# Patient Record
Sex: Female | Born: 2000 | Hispanic: No | Smoking: Never smoker
Health system: Southern US, Community
[De-identification: ages and names within clinical notes are randomized; demographics above are authoritative.]

## PROBLEM LIST (undated history)

## (undated) ENCOUNTER — Emergency Department (HOSPITAL_COMMUNITY): Admission: EM | Payer: Medicaid Other | Source: Home / Self Care

## (undated) DIAGNOSIS — J45909 Unspecified asthma, uncomplicated: Secondary | ICD-10-CM

## (undated) DIAGNOSIS — F909 Attention-deficit hyperactivity disorder, unspecified type: Secondary | ICD-10-CM

---
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
## 2019-06-22 ENCOUNTER — Encounter (HOSPITAL_COMMUNITY): Payer: Self-pay | Admitting: Emergency Medicine

## 2019-06-22 ENCOUNTER — Emergency Department (HOSPITAL_COMMUNITY)
Admission: EM | Admit: 2019-06-22 | Discharge: 2019-06-22 | Discharge status: Against Medical Advice | Payer: Medicaid Other | Attending: Emergency Medicine | Admitting: Emergency Medicine

## 2019-06-22 DIAGNOSIS — Z5321 Procedure and treatment not carried out due to patient leaving prior to being seen by health care provider: Secondary | ICD-10-CM | POA: Insufficient documentation

## 2019-06-22 DIAGNOSIS — R300 Dysuria: Secondary | ICD-10-CM | POA: Diagnosis present

## 2019-06-22 LAB — URINALYSIS, ROUTINE W REFLEX MICROSCOPIC
Bilirubin Urine: NEGATIVE
Glucose, UA: NEGATIVE mg/dL
Ketones, ur: 5 mg/dL — AB
Nitrite: NEGATIVE
Protein, ur: 100 mg/dL — AB
RBC / HPF: >50 RBC/hpf — ABNORMAL HIGH (ref 0–5)
Specific Gravity, Urine: 1.019 (ref 1.005–1.030)
WBC, UA: >50 WBC/hpf — ABNORMAL HIGH (ref 0–5)
pH: 6 (ref 5.0–8.0)

## 2019-06-22 NOTE — ED Triage Notes (Signed)
Pt here from home with c/o uti type symptoms , frequency burning upon urination and some scant amount of blood

## 2019-06-22 NOTE — ED Notes (Signed)
Pt called for room x2, no answer ?

## 2019-06-22 NOTE — ED Notes (Signed)
Called multiple times, will try again in a few mins

## 2019-11-15 ENCOUNTER — Encounter: Payer: Self-pay | Admitting: General Practice

## 2020-08-04 ENCOUNTER — Ambulatory Visit (HOSPITAL_COMMUNITY)
Admission: EM | Admit: 2020-08-04 | Discharge: 2020-08-04 | Disposition: A | Discharge status: Home / Self Care | Payer: Medicaid Other | Attending: Emergency Medicine | Admitting: Emergency Medicine

## 2020-08-04 ENCOUNTER — Other Ambulatory Visit: Payer: Self-pay

## 2020-08-04 ENCOUNTER — Encounter (HOSPITAL_COMMUNITY): Payer: Self-pay

## 2020-08-04 DIAGNOSIS — R300 Dysuria: Secondary | ICD-10-CM

## 2020-08-04 DIAGNOSIS — R8290 Unspecified abnormal findings in urine: Secondary | ICD-10-CM | POA: Diagnosis not present

## 2020-08-04 DIAGNOSIS — Z3202 Encounter for pregnancy test, result negative: Secondary | ICD-10-CM | POA: Diagnosis not present

## 2020-08-04 DIAGNOSIS — R350 Frequency of micturition: Secondary | ICD-10-CM

## 2020-08-04 LAB — POCT URINALYSIS DIPSTICK, ED / UC
Bilirubin Urine: NEGATIVE
Glucose, UA: 100 mg/dL — AB
Ketones, ur: NEGATIVE mg/dL
Nitrite: POSITIVE — AB
Protein, ur: 30 mg/dL — AB
Specific Gravity, Urine: 1.015 (ref 1.005–1.030)
Urobilinogen, UA: 1 mg/dL (ref 0.0–1.0)
pH: 6.5 (ref 5.0–8.0)

## 2020-08-04 LAB — POC URINE PREG, ED: Preg Test, Ur: NEGATIVE

## 2020-08-04 MED ORDER — NITROFURANTOIN MONOHYD MACRO 100 MG PO CAPS: 100.0000 mg | ORAL_CAPSULE | Freq: Two times a day (BID) | ORAL | 0 refills | Status: DC

## 2020-08-04 NOTE — ED Provider Notes (Signed)
[facility]   CC: UTI  SUBJECTIVE:  Jennifer Fischer is a 19 y.o. female who complains of urinary frequency, urgency and dysuria for the past 4 days.  Patient denies a precipitating event, recent sexual encounter, excessive caffeine intake. Localizes the pain to the lower abdomen. Pain is intermittent and describes it as sharp. Has history of UTIs. Is taking Azo with temporary relief.  Is also taking norethindrone birth control daily.  Reports that she gets this from an online clinic call [facility]. Symptoms are made worse with urination. Admits to similar symptoms in the past.  Denies fever, chills, nausea, vomiting, flank pain, abnormal vaginal discharge or bleeding, hematuria.    LMP: Patient's last menstrual period was 07/17/2020.  ROS: As in HPI.  All other pertinent ROS negative.     Past Medical History:  Diagnosis Date  . ADHD (attention deficit hyperactivity disorder)   . Asthma    History reviewed. No pertinent surgical history. No Known Allergies No current facility-administered medications on file prior to encounter.   Current Outpatient Medications on File Prior to Encounter  Medication Sig Dispense Refill  . albuterol (PROVENTIL HFA;VENTOLIN HFA) 108 (90 BASE) MCG/ACT inhaler Inhale 2 puffs into the lungs every 6 (six) hours as needed for wheezing or shortness of breath.    . beclomethasone (QVAR) 40 MCG/ACT inhaler Inhale 2 puffs into the lungs 2 (two) times daily.    . cetirizine (ZYRTEC) 10 MG tablet Take 10 mg by mouth at bedtime.  2  . cloNIDine (CATAPRES) 0.1 MG tablet Take 0.2 mg by mouth at bedtime.  1  . ibuprofen (ADVIL,MOTRIN) 400 MG tablet Take 1 tablet (400 mg total) by mouth every 6 (six) hours as needed. 24 tablet 0  . magic mouthwash SOLN Take 5 mLs by mouth 3 (three) times daily as needed for mouth pain. 45 mL 0  . methylphenidate (METADATE CD) 40 MG CR capsule Take 40 mg by mouth every morning.     Social History   Socioeconomic History   . Marital status: Single    Spouse name: Not on file  . Number of children: Not on file  . Years of education: Not on file  . Highest education level: Not on file  Occupational History  . Not on file  Tobacco Use  . Smoking status: Never Smoker  Substance and Sexual Activity  . Alcohol use: No  . Drug use: No  . Sexual activity: Not on file  Other Topics Concern  . Not on file  Social History Narrative  . Not on file   Social Determinants of Health   Financial Resource Strain:   . Difficulty of Paying Living Expenses: Not on file  Food Insecurity:   . Worried About Programme researcher, broadcasting/film/video in the Last Year: Not on file  . Ran Out of Food in the Last Year: Not on file  Transportation Needs:   . Lack of Transportation (Medical): Not on file  . Lack of Transportation (Non-Medical): Not on file  Physical Activity:   . Days of Exercise per Week: Not on file  . Minutes of Exercise per Session: Not on file  Stress:   . Feeling of Stress : Not on file  Social Connections:   . Frequency of Communication with Friends and Family: Not on file  . Frequency of Social Gatherings with Friends and Family: Not on file  . Attends Religious Services: Not on file  . Active Member of Clubs or Organizations:  Not on file  . Attends Banker Meetings: Not on file  . Marital Status: Not on file  Intimate Partner Violence:   . Fear of Current or Ex-Partner: Not on file  . Emotionally Abused: Not on file  . Physically Abused: Not on file  . Sexually Abused: Not on file   History reviewed. No pertinent family history.  OBJECTIVE:  Vitals:   08/04/20 1502  BP: 109/69  Pulse: 91  Resp: 16  Temp: 98.4 F (36.9 C)  TempSrc: Oral  SpO2: 100%   General appearance: AOx3 in no acute distress HEENT: NCAT. Oropharynx clear.  Lungs: clear to auscultation bilaterally without adventitious breath sounds Heart: regular rate and rhythm. Radial pulses 2+ symmetrical bilaterally Abdomen:  soft; non-distended; suprapubic tenderness; bowel sounds present; no guarding or rebound tenderness Back: no CVA tenderness Extremities: no edema; symmetrical with no gross deformities Skin: warm and dry Neurologic: Ambulates from chair to exam table without difficulty Psychological: alert and cooperative; normal mood and affect  Labs Reviewed  POCT URINALYSIS DIPSTICK, ED / UC - Abnormal; Notable for the following components:      Result Value   Glucose, UA 100 (*)    Hgb urine dipstick TRACE (*)    Protein, ur 30 (*)    Nitrite POSITIVE (*)    Leukocytes,Ua LARGE (*)    All other components within normal limits  URINE CULTURE  POC URINE PREG, ED    ASSESSMENT & PLAN:  1. Dysuria   2. Urinary frequency   3. Malodorous urine     Meds ordered this encounter  Medications  . nitrofurantoin, macrocrystal-monohydrate, (MACROBID) 100 MG capsule    Sig: Take 1 capsule (100 mg total) by mouth 2 (two) times daily.    Dispense:  10 capsule    Refill:  0    Order Specific Question:   Supervising Provider    Answer:   Merrilee Jansky [id number]    Urine culture sent  We will call you with abnormal results that need further treatment Push fluids and get plenty of rest Prescribed Macrobid Take antibiotic as directed and to completion Take pyridium as prescribed and as needed for symptomatic relief Follow up with PCP if symptoms persists Return here or go to ER if you have any new or worsening symptoms such as fever, worsening abdominal pain, nausea/vomiting, flank pain  Outlined signs and symptoms indicating need for more acute intervention Patient verbalized understanding After Visit Summary given     [name], NP 08/04/20 1611

## 2020-08-04 NOTE — ED Triage Notes (Signed)
Pt present urinary frequency,burning while urinating and abdominal pain. Symptoms started a week ago.pt state her urinary has a foul odor smell too.

## 2020-08-04 NOTE — Discharge Instructions (Signed)
I have sent in Macrobid for you to take twice a day for 5 days  You may continue to use Azo for the next 2 days  We will culture your urine and will call if treatment needs to be changed  Follow-up with this office or primary care if symptoms are not improving over the next 2 days

## 2020-08-05 ENCOUNTER — Telehealth (HOSPITAL_COMMUNITY): Payer: Self-pay | Admitting: *Deleted

## 2020-08-05 MED ORDER — NITROFURANTOIN MONOHYD MACRO 100 MG PO CAPS: 100.0000 mg | ORAL_CAPSULE | Freq: Two times a day (BID) | ORAL | 0 refills | Status: DC

## 2020-08-06 LAB — URINE CULTURE: Culture: >=100000 — AB

## 2020-08-10 ENCOUNTER — Telehealth (HOSPITAL_COMMUNITY): Payer: Self-pay | Admitting: Emergency Medicine

## 2020-08-10 NOTE — Telephone Encounter (Signed)
Patient called stating she is unable to keep the MAcrobid she was prescribed down, that she had "vomiting all night long" when she tried to take it.  Will follow up with provider for guidance on changes.

## 2020-08-10 NOTE — Telephone Encounter (Signed)
Reached out to [place], APP, and further assessment was done of patient.  She states she has had body aches and "not a normal body temperature" as well.   Per Judeth Cornfield, APP encourage patient to return for follow up visit.  Patient was informed and verbalized understanding.

## 2020-11-08 ENCOUNTER — Other Ambulatory Visit: Payer: Medicaid Other

## 2021-01-28 ENCOUNTER — Emergency Department (HOSPITAL_BASED_OUTPATIENT_CLINIC_OR_DEPARTMENT_OTHER): Payer: Medicaid Other

## 2021-01-28 ENCOUNTER — Other Ambulatory Visit: Payer: Self-pay

## 2021-01-28 ENCOUNTER — Emergency Department (HOSPITAL_BASED_OUTPATIENT_CLINIC_OR_DEPARTMENT_OTHER)
Admission: EM | Admit: 2021-01-28 | Discharge: 2021-01-28 | Disposition: A | Discharge status: Home / Self Care | Payer: Medicaid Other | Attending: Emergency Medicine | Admitting: Emergency Medicine

## 2021-01-28 ENCOUNTER — Encounter (HOSPITAL_BASED_OUTPATIENT_CLINIC_OR_DEPARTMENT_OTHER): Payer: Self-pay

## 2021-01-28 DIAGNOSIS — Y92410 Unspecified street and highway as the place of occurrence of the external cause: Secondary | ICD-10-CM | POA: Diagnosis not present

## 2021-01-28 DIAGNOSIS — S161XXA Strain of muscle, fascia and tendon at neck level, initial encounter: Secondary | ICD-10-CM | POA: Diagnosis not present

## 2021-01-28 DIAGNOSIS — S8001XA Contusion of right knee, initial encounter: Secondary | ICD-10-CM | POA: Insufficient documentation

## 2021-01-28 DIAGNOSIS — S32020A Wedge compression fracture of second lumbar vertebra, initial encounter for closed fracture: Secondary | ICD-10-CM | POA: Diagnosis not present

## 2021-01-28 DIAGNOSIS — Y939 Activity, unspecified: Secondary | ICD-10-CM | POA: Diagnosis not present

## 2021-01-28 DIAGNOSIS — S32030A Wedge compression fracture of third lumbar vertebra, initial encounter for closed fracture: Secondary | ICD-10-CM | POA: Insufficient documentation

## 2021-01-28 DIAGNOSIS — R55 Syncope and collapse: Secondary | ICD-10-CM | POA: Diagnosis not present

## 2021-01-28 DIAGNOSIS — S199XXA Unspecified injury of neck, initial encounter: Secondary | ICD-10-CM | POA: Diagnosis present

## 2021-01-28 DIAGNOSIS — Z79899 Other long term (current) drug therapy: Secondary | ICD-10-CM | POA: Insufficient documentation

## 2021-01-28 DIAGNOSIS — S9001XA Contusion of right ankle, initial encounter: Secondary | ICD-10-CM | POA: Diagnosis not present

## 2021-01-28 DIAGNOSIS — Y999 Unspecified external cause status: Secondary | ICD-10-CM | POA: Diagnosis not present

## 2021-01-28 DIAGNOSIS — J45909 Unspecified asthma, uncomplicated: Secondary | ICD-10-CM | POA: Diagnosis not present

## 2021-01-28 LAB — URINALYSIS, ROUTINE W REFLEX MICROSCOPIC
Bilirubin Urine: NEGATIVE
Glucose, UA: NEGATIVE mg/dL
Hgb urine dipstick: NEGATIVE
Ketones, ur: NEGATIVE mg/dL
Leukocytes,Ua: NEGATIVE
Nitrite: NEGATIVE
Protein, ur: 100 mg/dL — AB
Specific Gravity, Urine: >1.03 — ABNORMAL HIGH (ref 1.005–1.030)
pH: 6.5 (ref 5.0–8.0)

## 2021-01-28 LAB — URINALYSIS, MICROSCOPIC (REFLEX)

## 2021-01-28 LAB — PREGNANCY, URINE: Preg Test, Ur: NEGATIVE

## 2021-01-28 MED ORDER — IBUPROFEN 800 MG PO TABS
800.0000 mg | ORAL_TABLET | Freq: Once | ORAL | Status: AC
  Administered 2021-01-28: 800 mg via ORAL
  Filled 2021-01-28: qty 1

## 2021-01-28 MED ORDER — IBUPROFEN 800 MG PO TABS: 800.0000 mg | ORAL_TABLET | Freq: Three times a day (TID) | ORAL | 0 refills | Status: DC | PRN

## 2021-01-28 NOTE — ED Notes (Signed)
Pt drinking her tea form [employer]
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
## 2021-01-28 NOTE — ED Notes (Signed)
Someone from the [facility] will be bringing a  Lumbar corsett for pt to wear , she will be here in abot an 1 1/2 hors . Pt ok with this
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
## 2022-11-16 ENCOUNTER — Emergency Department (HOSPITAL_BASED_OUTPATIENT_CLINIC_OR_DEPARTMENT_OTHER)
Admission: EM | Admit: 2022-11-16 | Discharge: 2022-11-16 | Disposition: A | Discharge status: Home / Self Care | Payer: Medicaid Other | Attending: Emergency Medicine | Admitting: Emergency Medicine

## 2022-11-16 ENCOUNTER — Other Ambulatory Visit: Payer: Self-pay

## 2022-11-16 ENCOUNTER — Encounter (HOSPITAL_BASED_OUTPATIENT_CLINIC_OR_DEPARTMENT_OTHER): Payer: Self-pay

## 2022-11-16 DIAGNOSIS — R059 Cough, unspecified: Secondary | ICD-10-CM | POA: Diagnosis present

## 2022-11-16 DIAGNOSIS — U071 COVID-19: Secondary | ICD-10-CM | POA: Insufficient documentation

## 2022-11-16 LAB — RESP PANEL BY RT-PCR (RSV, FLU A&B, COVID)  RVPGX2
Influenza A by PCR: NEGATIVE
Influenza B by PCR: NEGATIVE
Resp Syncytial Virus by PCR: NEGATIVE
SARS Coronavirus 2 by RT PCR: POSITIVE — AB

## 2022-11-16 NOTE — ED Provider Notes (Signed)
[facility] Provider Note   CSN: [id number] Arrival date & time: 11/16/22  1810     History  Chief Complaint  Patient presents with   Nasal Congestion    Jennifer Fischer is a 22 y.o. female.  Patient complains of a cough and congestion.  Patient reports she has been sick on and off for 2 weeks but began feeling worse 3 days ago.  Patient complains of bodyaches sore throat and cough.  Patient denies being around anyone who was sick  The history is provided by the patient. No language interpreter was used.       Home Medications Prior to Admission medications   Medication Sig Start Date End Date Taking? Authorizing Provider  albuterol (PROVENTIL HFA;VENTOLIN HFA) 108 (90 BASE) MCG/ACT inhaler Inhale 2 puffs into the lungs every 6 (six) hours as needed for wheezing or shortness of breath.    [provider]  beclomethasone (QVAR) 40 MCG/ACT inhaler Inhale 2 puffs into the lungs 2 (two) times daily.    [provider]  cetirizine (ZYRTEC) 10 MG tablet Take 10 mg by mouth at bedtime. 08/27/15   [provider]  cloNIDine (CATAPRES) 0.1 MG tablet Take 0.2 mg by mouth at bedtime. 07/23/15   [provider]  ibuprofen (ADVIL) 800 MG tablet Take 1 tablet (800 mg total) by mouth every 8 (eight) hours as needed. 01/28/21   Hayden Rasmussen, MD  magic mouthwash SOLN Take 5 mLs by mouth 3 (three) times daily as needed for mouth pain. 09/07/15   Gloriann Loan, PA-C  methylphenidate (METADATE CD) 40 MG CR capsule Take 40 mg by mouth every morning.    [provider]  nitrofurantoin, macrocrystal-monohydrate, (MACROBID) 100 MG capsule Take 1 capsule (100 mg total) by mouth 2 (two) times daily. 08/05/20   Faustino Congress, NP      Allergies    Patient has no known allergies.    Review of Systems   Review of Systems  All other systems reviewed and are negative.   Physical Exam Updated Vital Signs BP 128/85 (BP  Location: Right Arm)   Pulse (!) 110   Temp 99.1 F (37.3 C) (Oral)   Resp 16   Ht 5\' 2"  (1.575 m)   Wt 46.4 kg   LMP 10/31/2022 (Exact Date)   SpO2 100%   BMI 18.71 kg/m  Physical Exam Vitals and nursing note reviewed.  Constitutional:      General: She is not in acute distress.    Appearance: She is well-developed.  HENT:     Head: Normocephalic and atraumatic.  Eyes:     Conjunctiva/sclera: Conjunctivae normal.  Cardiovascular:     Rate and Rhythm: Normal rate and regular rhythm.     Heart sounds: No murmur heard. Pulmonary:     Effort: Pulmonary effort is normal. No respiratory distress.     Breath sounds: Normal breath sounds.  Abdominal:     Palpations: Abdomen is soft.     Tenderness: There is no abdominal tenderness.  Musculoskeletal:        General: No swelling.     Cervical back: Neck supple.  Skin:    General: Skin is warm and dry.     Capillary Refill: Capillary refill takes less than 2 seconds.  Neurological:     Mental Status: She is alert.  Psychiatric:        Mood and Affect: Mood normal.     ED Results / Procedures / Treatments  Labs (all labs ordered are listed, but only abnormal results are displayed) Labs Reviewed  RESP PANEL BY RT-PCR (RSV, FLU A&B, COVID)  RVPGX2 - Abnormal; Notable for the following components:      Result Value   SARS Coronavirus 2 by RT PCR POSITIVE (*)    All other components within normal limits    EKG None  Radiology No results found.  Procedures Procedures    Medications Ordered in ED Medications - No data to display  ED Course/ Medical Decision Making/ A&P                             Medical Decision Making Patient complains of a cough fever and bodyaches  Amount and/or Complexity of Data Reviewed Labs: ordered. Decision-making details documented in ED Course.    Details: Labs ordered reviewed and interpreted COVID is positive  Risk Risk Details: Still on COVID management she is advised  Tylenol or ibuprofen for fever and body aches patient is given a note to be out of work for the next 3 days.            Final Clinical Impression(s) / ED Diagnoses Final diagnoses:  COVID    Rx / DC Orders ED Discharge Orders     None      An After Visit Summary was printed and given to the patient.    Fransico Meadow, PA-C 11/16/22 [place], [place], DO 11/16/22 2308

## 2022-11-16 NOTE — Discharge Instructions (Addendum)
Tylenol or ibuprofen for fever and body aches

## 2022-11-16 NOTE — ED Triage Notes (Signed)
Pt states she has had cold symptoms, nausea, fatigue x 2 weeks.  Low grade fever

## 2023-01-03 ENCOUNTER — Emergency Department (HOSPITAL_BASED_OUTPATIENT_CLINIC_OR_DEPARTMENT_OTHER)
Admission: EM | Admit: 2023-01-03 | Discharge: 2023-01-03 | Disposition: A | Discharge status: Home / Self Care | Payer: Medicaid Other | Attending: Emergency Medicine | Admitting: Emergency Medicine

## 2023-01-03 ENCOUNTER — Encounter (HOSPITAL_BASED_OUTPATIENT_CLINIC_OR_DEPARTMENT_OTHER): Payer: Self-pay

## 2023-01-03 ENCOUNTER — Other Ambulatory Visit: Payer: Self-pay

## 2023-01-03 DIAGNOSIS — R0981 Nasal congestion: Secondary | ICD-10-CM | POA: Insufficient documentation

## 2023-01-03 DIAGNOSIS — Z20822 Contact with and (suspected) exposure to covid-19: Secondary | ICD-10-CM | POA: Insufficient documentation

## 2023-01-03 LAB — RESP PANEL BY RT-PCR (RSV, FLU A&B, COVID)  RVPGX2
Influenza A by PCR: NEGATIVE
Influenza B by PCR: NEGATIVE
Resp Syncytial Virus by PCR: NEGATIVE
SARS Coronavirus 2 by RT PCR: NEGATIVE

## 2023-01-03 LAB — PREGNANCY, URINE: Preg Test, Ur: NEGATIVE

## 2023-01-03 NOTE — ED Triage Notes (Signed)
C/o congestion, cough, headache x 1 week. Also took a pregnancy test last week and yesterday which came back positive. LMP 1/29. G1P0.

## 2023-01-03 NOTE — ED Notes (Signed)
ED Provider at bedside. 

## 2023-01-03 NOTE — Discharge Instructions (Signed)
Today I offered you a blood pregnancy test to see if you were pregnant and just very early on in your pregnancy, you declined, your urine pregnancy test was negative however.  Please follow-up with your primary care doctor, for a blood pregnancy test for confirmation of results.  Additionally please return to the ER if you have any severe shortness of breath.

## 2023-01-03 NOTE — ED Provider Notes (Signed)
[place] [place] [facility] Provider Note   CSN: [id number] Arrival date & time: 01/03/23  1459     History  Chief Complaint  Patient presents with   Nasal Congestion    Jennifer Fischer is a 22 y.o. female, no pertinent past medical history, who presents to the ED secondary to a pain positive line on a pregnancy test, as well as acute congestion for the last week.  She denies any shortness of breath cough, vomiting, nausea, ear pain, sore throat.    Home Medications Prior to Admission medications   Medication Sig Start Date End Date Taking? Authorizing Provider  albuterol (PROVENTIL HFA;VENTOLIN HFA) 108 (90 BASE) MCG/ACT inhaler Inhale 2 puffs into the lungs every 6 (six) hours as needed for wheezing or shortness of breath.    [provider]  beclomethasone (QVAR) 40 MCG/ACT inhaler Inhale 2 puffs into the lungs 2 (two) times daily.    [provider]  cetirizine (ZYRTEC) 10 MG tablet Take 10 mg by mouth at bedtime. 08/27/15   [provider]  cloNIDine (CATAPRES) 0.1 MG tablet Take 0.2 mg by mouth at bedtime. 07/23/15   [provider]  ibuprofen (ADVIL) 800 MG tablet Take 1 tablet (800 mg total) by mouth every 8 (eight) hours as needed. 01/28/21   Hayden Rasmussen, MD  magic mouthwash SOLN Take 5 mLs by mouth 3 (three) times daily as needed for mouth pain. 09/07/15   Gloriann Loan, PA-C  methylphenidate (METADATE CD) 40 MG CR capsule Take 40 mg by mouth every morning.    [provider]  nitrofurantoin, macrocrystal-monohydrate, (MACROBID) 100 MG capsule Take 1 capsule (100 mg total) by mouth 2 (two) times daily. 08/05/20   Faustino Congress, NP      Allergies    Patient has no known allergies.    Review of Systems   Review of Systems  HENT:  Positive for congestion.   Respiratory:  Negative for shortness of breath.     Physical Exam Updated Vital Signs BP 102/63   Pulse 91   Temp 98.8 F (37.1 C)  (Oral)   Resp 14   LMP 12/01/2022 (Exact Date)   SpO2 100%  Physical Exam Vitals and nursing note reviewed.  Constitutional:      General: She is not in acute distress.    Appearance: She is well-developed.  HENT:     Head: Normocephalic and atraumatic.     Nose: Congestion present.  Eyes:     Conjunctiva/sclera: Conjunctivae normal.  Cardiovascular:     Rate and Rhythm: Normal rate and regular rhythm.     Heart sounds: No murmur heard. Pulmonary:     Effort: Pulmonary effort is normal. No respiratory distress.     Breath sounds: Normal breath sounds.  Abdominal:     Palpations: Abdomen is soft.     Tenderness: There is no abdominal tenderness.  Musculoskeletal:        General: No swelling.     Cervical back: Neck supple.  Skin:    General: Skin is warm and dry.     Capillary Refill: Capillary refill takes less than 2 seconds.  Neurological:     Mental Status: She is alert.  Psychiatric:        Mood and Affect: Mood normal.     ED Results / Procedures / Treatments   Labs (all labs ordered are listed, but only abnormal results are displayed) Labs Reviewed  RESP PANEL BY RT-PCR (RSV,  FLU A&B, COVID)  RVPGX2  PREGNANCY, URINE    EKG None  Radiology No results found.  Procedures Procedures    Medications Ordered in ED Medications - No data to display  ED Course/ Medical Decision Making/ A&P                             Medical Decision Making Patient is a 22 year old female, here for congestion, sent for a positive pregnancy test.  We will retest her urine, and test her for COVID flu, RSV for further evaluation.  She is well-appearing.  Last menstrual period was 12/01/22.  Amount and/or Complexity of Data Reviewed Labs: ordered.    Details: Urine pregnancy test negative, COVID/flu/RSV negative. Discussion of management or test interpretation with external provider(s): Discussed with patient, may be early on, recommended blood pregnancy test, she declined,  we discussed follow-up with her primary care doctor, for blood testing, and using intranasal saline forcongestion.   Final Clinical Impression(s) / ED Diagnoses Final diagnoses:  Congestion of nasal sinus    Rx / DC Orders ED Discharge Orders     None         Juanitta Earnhardt, Si Gaul, PA 01/03/23 [place], Ankit, MD 01/04/23 1623

## 2023-01-03 NOTE — ED Provider Triage Note (Signed)
Emergency Medicine Provider Triage Evaluation Note  Jennifer Fischer , a 22 y.o. female  was evaluated in triage.  Pt complains of congestion and a faintly positive pregnancy test. She wanted to come to the ER fo confirm. Reports LMP was 12/01/22. Nasal congestion has been present for a week. No sore throat or fevers. No belly pain., dysuria, or hematuria.  Review of Systems  Positive:  Negative:   Physical Exam  BP 102/63   Pulse 91   Temp 98.8 F (37.1 C) (Oral)   Resp 14   LMP 12/01/2022 (Exact Date)   SpO2 100%  Gen:   Awake, no distress   Resp:  Normal effort  MSK:   Moves extremities without difficulty  Other:    Medical Decision Making  Medically screening exam initiated at 3:33 PM.  Appropriate orders placed.  Jennifer Fischer was informed that the remainder of the evaluation will be completed by another provider, this initial triage assessment does not replace that evaluation, and the importance of remaining in the ED until their evaluation is complete.  Swab and urine preg ordred    Sherrell Puller, [place] 01/03/23 1535

## 2023-02-20 ENCOUNTER — Other Ambulatory Visit: Payer: Self-pay

## 2023-02-20 ENCOUNTER — Encounter (HOSPITAL_COMMUNITY): Payer: Self-pay

## 2023-02-20 ENCOUNTER — Emergency Department (HOSPITAL_COMMUNITY): Payer: Self-pay

## 2023-02-20 ENCOUNTER — Emergency Department (HOSPITAL_COMMUNITY)
Admission: EM | Admit: 2023-02-20 | Discharge: 2023-02-20 | Disposition: A | Discharge status: Home / Self Care | Payer: Self-pay | Attending: Emergency Medicine | Admitting: Emergency Medicine

## 2023-02-20 DIAGNOSIS — W228XXA Striking against or struck by other objects, initial encounter: Secondary | ICD-10-CM | POA: Insufficient documentation

## 2023-02-20 DIAGNOSIS — Y9339 Activity, other involving climbing, rappelling and jumping off: Secondary | ICD-10-CM | POA: Insufficient documentation

## 2023-02-20 DIAGNOSIS — S5001XA Contusion of right elbow, initial encounter: Secondary | ICD-10-CM | POA: Insufficient documentation

## 2023-02-20 NOTE — Discharge Instructions (Signed)
You have been seen today for your complaint of right elbow pain. Your imaging was reassuring and showed no abnormalities. Your discharge medications include Alternate tylenol and ibuprofen for pain. You may alternate these every 4 hours. You may take up to 800 mg of ibuprofen at a time and up to 1000 mg of tylenol. Home care instructions are as follows:  Continue icing for 15 minutes at a time, multiple times a day.  Do not place the ice directly on your skin Follow up with: Your primary care provider in 7 days if needed Please seek immediate medical care if you develop any of the following symptoms: Your skin over the contusion breaks and starts bleeding. You have severe pain. You have numbness in your hand or fingers. Your hand or fingers turn pale or cold. You have swelling of your hand and fingers. You cannot move your fingers or wrist. At this time there does not appear to be the presence of an emergent medical condition, however there is always the potential for conditions to change. Please read and follow the below instructions.  Do not take your medicine if  develop an itchy rash, swelling in your mouth or lips, or difficulty breathing; call 911 and seek immediate emergency medical attention if this occurs.  You may review your lab tests and imaging results in their entirety on your MyChart account.  Please discuss all results of fully with your primary care provider and other specialist at your follow-up visit.  Note: Portions of this text may have been transcribed using voice recognition software. Every effort was made to ensure accuracy; however, inadvertent computerized transcription errors may still be present.

## 2023-02-20 NOTE — ED Triage Notes (Signed)
Pt arrives with c/o right elbow pain after hitting it on a soda can. Per pt, elbow is swollen and painful to touch. No obvious deformity noted.

## 2023-02-20 NOTE — ED Provider Notes (Signed)
[place] [facility] AT [facility] Provider Note   CSN: [id number] Arrival date & time: 02/20/23  1558     History  Chief Complaint  Patient presents with   Arm Injury    Jennifer Fischer is a 22 y.o. female.  Who presents to the ED for evaluation of right elbow pain.  She states she was playing a video game yesterday evening when she died in the videogame.  After that she jumped up and when she came back down, her right elbow landed on a can of soda.  She states her right elbow has begun to ache more since that incident.  She iced the elbow with some improvement, but reports pain with flexion and extension of the elbow.  She denies numbness, weakness or tingling.  She denies wounds to the elbow.   Arm Injury      Home Medications Prior to Admission medications   Medication Sig Start Date End Date Taking? Authorizing Provider  albuterol (PROVENTIL HFA;VENTOLIN HFA) 108 (90 BASE) MCG/ACT inhaler Inhale 2 puffs into the lungs every 6 (six) hours as needed for wheezing or shortness of breath.    [provider]  beclomethasone (QVAR) 40 MCG/ACT inhaler Inhale 2 puffs into the lungs 2 (two) times daily.    [provider]  cetirizine (ZYRTEC) 10 MG tablet Take 10 mg by mouth at bedtime. 08/27/15   [provider]  cloNIDine (CATAPRES) 0.1 MG tablet Take 0.2 mg by mouth at bedtime. 07/23/15   [provider]  ibuprofen (ADVIL) 800 MG tablet Take 1 tablet (800 mg total) by mouth every 8 (eight) hours as needed. 01/28/21   [name], MD  magic mouthwash SOLN Take 5 mLs by mouth 3 (three) times daily as needed for mouth pain. 09/07/15   [name], PA-C  methylphenidate (METADATE CD) 40 MG CR capsule Take 40 mg by mouth every morning.    [provider]  nitrofurantoin, macrocrystal-monohydrate, (MACROBID) 100 MG capsule Take 1 capsule (100 mg total) by mouth 2 (two) times daily. 08/05/20   [name], NP       Allergies    Patient has no known allergies.    Review of Systems   Review of Systems  Musculoskeletal:  Positive for arthralgias.  All other systems reviewed and are negative.   Physical Exam Updated Vital Signs BP 119/82 (BP Location: Right Arm)   Pulse 83   Temp 98.6 F (37 C) (Oral)   Resp 17   Ht  (1.575 m)   Wt 46.3 kg   LMP 02/02/2023   SpO2 100%   Breastfeeding Unknown   BMI 18.66 kg/m  Physical Exam Vitals and nursing note reviewed.  Constitutional:      General: She is not in acute distress.    Appearance: Normal appearance. She is normal weight. She is not ill-appearing.     Comments: Resting comfortably in bed  HENT:     Head: Normocephalic and atraumatic.  Pulmonary:     Effort: Pulmonary effort is normal. No respiratory distress.  Abdominal:     General: Abdomen is flat.  Musculoskeletal:        General: Normal range of motion.     Cervical back: Neck supple.     Comments: Mild TTP to the medial aspect of the right elbow.  No swelling, bruising, lesions, deformities, crepitus.  She has full active and passive ROM in flexion and extension of the elbow.  She has full  grip strength bilaterally.  Sensation intact.  Skin:    General: Skin is warm and dry.  Neurological:     Mental Status: She is alert and oriented to person, place, and time.  Psychiatric:        Mood and Affect: Mood normal.        Behavior: Behavior normal.     ED Results / Procedures / Treatments   Labs (all labs ordered are listed, but only abnormal results are displayed) Labs Reviewed - No data to display  EKG None  Radiology DG Elbow Complete Right  Result Date: 02/20/2023 CLINICAL DATA:  Right elbow pain EXAM: RIGHT ELBOW - COMPLETE 3 VIEW COMPARISON:  None Available. FINDINGS: Evaluation is somewhat limited by atypical positioning. Within this limitation, there is no evidence of fracture, dislocation, or joint effusion. There is no evidence of arthropathy or other  focal bone abnormality. No foreign body. IMPRESSION: Evaluation is somewhat limited by atypical positioning. Within this limitation, no acute fracture or dislocation. Electronically Signed   By: Wiliam Ke M.D.   On: 02/20/2023 16:46    Procedures Procedures    Medications Ordered in ED Medications - No data to display  ED Course/ Medical Decision Making/ A&P                             Medical Decision Making Amount and/or Complexity of Data Reviewed Radiology: ordered.  This patient presents to the ED for concern of right elbow pain, this involves an extensive number of treatment options.  The differential diagnosis includes fracture, strain, sprain, contusion  My initial workup includes x-ray  Additional history obtained from: Nursing notes from this visit.  I ordered imaging studies including x-ray right elbow I independently visualized and interpreted imaging which showed negative I agree with the radiologist interpretation  Afebrile, hemodynamically stable.  22 year old female presenting to the ED for evaluation of right elbow pain.  She appears very well on exam.  There are no abnormalities aside from mild TTP.  X-ray negative.  Patient likely has a contusion to the right elbow.  She was educated on appropriate use of ice, Tylenol and ibuprofen at home for pain.  She is encouraged to follow-up with her primary care provider in 1 week if not improving.  She was given return precautions.  Stable at discharge.  At this time there does not appear to be any evidence of an acute emergency medical condition and the patient appears stable for discharge with appropriate outpatient follow up. Diagnosis was discussed with patient who verbalizes understanding of care plan and is agreeable to discharge. I have discussed return precautions with patient and significant other who verbalizes understanding. Patient encouraged to follow-up with their PCP within 1 week. All questions  answered.  Note: Portions of this report may have been transcribed using voice recognition software. Every effort was made to ensure accuracy; however, inadvertent computerized transcription errors may still be present.        Final Clinical Impression(s) / ED Diagnoses Final diagnoses:  Contusion of right elbow, initial encounter    Rx / DC Orders ED Discharge Orders     None         Michelle Piper, Cordelia Poche 02/20/23 1813    Derwood Kaplan, MD 02/25/23 1255

## 2023-04-07 ENCOUNTER — Emergency Department (HOSPITAL_COMMUNITY)
Admission: EM | Admit: 2023-04-07 | Discharge: 2023-04-07 | Disposition: A | Discharge status: Home / Self Care | Payer: Medicaid Other | Attending: Emergency Medicine | Admitting: Emergency Medicine

## 2023-04-07 ENCOUNTER — Emergency Department (HOSPITAL_COMMUNITY): Payer: Medicaid Other

## 2023-04-07 ENCOUNTER — Other Ambulatory Visit: Payer: Self-pay

## 2023-04-07 ENCOUNTER — Encounter (HOSPITAL_COMMUNITY): Payer: Self-pay

## 2023-04-07 DIAGNOSIS — Z3A01 Less than 8 weeks gestation of pregnancy: Secondary | ICD-10-CM | POA: Diagnosis not present

## 2023-04-07 DIAGNOSIS — O219 Vomiting of pregnancy, unspecified: Secondary | ICD-10-CM | POA: Diagnosis present

## 2023-04-07 DIAGNOSIS — Z3491 Encounter for supervision of normal pregnancy, unspecified, first trimester: Secondary | ICD-10-CM

## 2023-04-07 LAB — COMPREHENSIVE METABOLIC PANEL
ALT: 110 U/L — ABNORMAL HIGH (ref 0–44)
AST: 95 U/L — ABNORMAL HIGH (ref 15–41)
Albumin: 4 g/dL (ref 3.5–5.0)
Alkaline Phosphatase: 79 U/L (ref 38–126)
Anion gap: 7 (ref 5–15)
BUN: 7 mg/dL (ref 6–20)
CO2: 23 mmol/L (ref 22–32)
Calcium: 8.6 mg/dL — ABNORMAL LOW (ref 8.9–10.3)
Chloride: 105 mmol/L (ref 98–111)
Creatinine, Ser: 0.65 mg/dL (ref 0.44–1.00)
GFR, Estimated: >60 mL/min (ref >60)
Glucose, Bld: 88 mg/dL (ref 70–99)
Potassium: 3.6 mmol/L (ref 3.5–5.1)
Sodium: 135 mmol/L (ref 135–145)
Total Bilirubin: 0.7 mg/dL (ref 0.3–1.2)
Total Protein: 7.4 g/dL (ref 6.5–8.1)

## 2023-04-07 LAB — URINALYSIS, ROUTINE W REFLEX MICROSCOPIC
Bilirubin Urine: NEGATIVE
Glucose, UA: NEGATIVE mg/dL
Hgb urine dipstick: NEGATIVE
Ketones, ur: NEGATIVE mg/dL
Leukocytes,Ua: NEGATIVE
Nitrite: NEGATIVE
Protein, ur: NEGATIVE mg/dL
Specific Gravity, Urine: 1.001 — ABNORMAL LOW (ref 1.005–1.030)
pH: 7 (ref 5.0–8.0)

## 2023-04-07 LAB — CBC WITH DIFFERENTIAL/PLATELET
Abs Immature Granulocytes: 0.01 10*3/uL (ref 0.00–0.07)
Basophils Absolute: 0 10*3/uL (ref 0.0–0.1)
Basophils Relative: 1 %
Eosinophils Absolute: 0.2 10*3/uL (ref 0.0–0.5)
Eosinophils Relative: 2 %
HCT: 38.4 % (ref 36.0–46.0)
Hemoglobin: 12.2 g/dL (ref 12.0–15.0)
Immature Granulocytes: 0 %
Lymphocytes Relative: 51 %
Lymphs Abs: 4.5 10*3/uL — ABNORMAL HIGH (ref 0.7–4.0)
MCH: 22.3 pg — ABNORMAL LOW (ref 26.0–34.0)
MCHC: 31.8 g/dL (ref 30.0–36.0)
MCV: 70.2 fL — ABNORMAL LOW (ref 80.0–100.0)
Monocytes Absolute: 0.5 10*3/uL (ref 0.1–1.0)
Monocytes Relative: 6 %
Neutro Abs: 3.5 10*3/uL (ref 1.7–7.7)
Neutrophils Relative %: 40 %
Platelets: 296 10*3/uL (ref 150–400)
RBC: 5.47 MIL/uL — ABNORMAL HIGH (ref 3.87–5.11)
RDW: 16.3 % — ABNORMAL HIGH (ref 11.5–15.5)
WBC: 8.7 10*3/uL (ref 4.0–10.5)
nRBC: 0 % (ref 0.0–0.2)

## 2023-04-07 LAB — HCG, QUANTITATIVE, PREGNANCY: hCG, Beta Chain, Quant, S: 2881 m[IU]/mL — ABNORMAL HIGH (ref <5)

## 2023-04-07 LAB — PREGNANCY, URINE: Preg Test, Ur: POSITIVE — AB

## 2023-04-07 MED ORDER — DOXYLAMINE SUCCINATE (SLEEP) 25 MG PO TABS: 12.5000 mg | ORAL_TABLET | Freq: Three times a day (TID) | ORAL | 0 refills | Status: DC | PRN

## 2023-04-07 MED ORDER — VITAMIN B-6 25 MG PO TABS: 25.0000 mg | ORAL_TABLET | Freq: Three times a day (TID) | ORAL | 0 refills | Status: AC | PRN

## 2023-04-07 NOTE — Discharge Instructions (Addendum)
Your pregnancy hormone is 2,881. We see a gestational sac on your ultrasound which is reassuring. We do not yet see an embryo which is likely due to how early you are in your pregnancy. I recommend having your pregnancy hormone rechecked in 2-3 days to make sure it is appropriately increasing and having a repeat ultrasound in one week. I referred you to OB/GYN. Sometimes they do not see patients until later in their pregnancy however so you may return to ED if needed for this testing.   I prescribed medications to help with nausea as needed. For pain, you can take over the counter Tylenol.   For any new or worsening symptoms, return to nearest ED for re-evaluation.

## 2023-04-07 NOTE — ED Triage Notes (Signed)
Patient arrived POV. Patient reports States she has taken 3 pregnancy test, all positive. Reports has been having symptoms of pregnancy. States has abdominal pain intermittently. None at this time. Would like a pregnancy test. AAOX4, respirations even and unlabored. NAD. Denies any other symptoms.

## 2023-04-07 NOTE — ED Provider Notes (Signed)
Jenn[facility] EMERGENCY DEPARTMENT AT [facility] Provider Note   CSN: [id number] Arrival date & time: 04/07/23  1512     History {Add pertinent medical, surgical, social history, OB history to HPI:1} Chief Complaint  Patient presents with   Nausea    States she has taken 3 pregnancy test, all positive.     Jennifer Fischer is a 22 y.o. female who presents to the ED with concern for pregnancy.  She states that her last menstrual period was in early May but was lighter than normal.  Over the last 2 days, she has taken 3 home pregnancy tests which were also negative.  No previous pregnancies.  She states that she intermittently has lower abdominal pain, breast tenderness, and nausea.  No current abdominal pain.  Not currently followed by OB/GYN.  No vaginal bleeding or discharge.  No fevers. States came here to confirm pregnancy.       Home Medications Prior to Admission medications   Medication Sig Start Date End Date Taking? Authorizing Provider  albuterol (PROVENTIL HFA;VENTOLIN HFA) 108 (90 BASE) MCG/ACT inhaler Inhale 2 puffs into the lungs every 6 (six) hours as needed for wheezing or shortness of breath.    [provider]  beclomethasone (QVAR) 40 MCG/ACT inhaler Inhale 2 puffs into the lungs 2 (two) times daily.    [provider]  cetirizine (ZYRTEC) 10 MG tablet Take 10 mg by mouth at bedtime. 08/27/15   [provider]  cloNIDine (CATAPRES) 0.1 MG tablet Take 0.2 mg by mouth at bedtime. 07/23/15   [provider]  ibuprofen (ADVIL) 800 MG tablet Take 1 tablet (800 mg total) by mouth every 8 (eight) hours as needed. 01/28/21   [name], MD  magic mouthwash SOLN Take 5 mLs by mouth 3 (three) times daily as needed for mouth pain. 09/07/15   [name], PA-C  methylphenidate (METADATE CD) 40 MG CR capsule Take 40 mg by mouth every morning.    [provider]  nitrofurantoin, macrocrystal-monohydrate, (MACROBID) 100 MG  capsule Take 1 capsule (100 mg total) by mouth 2 (two) times daily. 08/05/20   [name], NP      Allergies    Patient has no known allergies.    Review of Systems   Review of Systems  All other systems reviewed and are negative.   Physical Exam Updated Vital Signs BP 124/82 (BP Location: Right Arm)   Pulse 90   Temp 98.6 F (37 C) (Oral)   Resp 18   Ht 5\' 2"  (1.575 m)   Wt 46.3 kg   LMP 03/04/2023   SpO2 100%   BMI 18.67 kg/m  Physical Exam Vitals and nursing note reviewed.  Constitutional:      General: She is not in acute distress.    Appearance: Normal appearance.  HENT:     Head: Normocephalic and atraumatic.     Mouth/Throat:     Mouth: Mucous membranes are moist.  Eyes:     Conjunctiva/sclera: Conjunctivae normal.  Cardiovascular:     Rate and Rhythm: Normal rate and regular rhythm.     Fischer sounds: No murmur heard. Pulmonary:     Effort: Pulmonary effort is normal.     Breath sounds: Normal breath sounds.  Abdominal:     General: Abdomen is flat. There is no distension.     Palpations: Abdomen is soft.     Tenderness: There is no abdominal tenderness. There is no right CVA tenderness, left CVA  tenderness, guarding or rebound.  Musculoskeletal:        General: Normal range of motion.     Cervical back: Neck supple.     Right lower leg: No edema.     Left lower leg: No edema.  Skin:    General: Skin is warm and dry.     Capillary Refill: Capillary refill takes less than 2 seconds.  Neurological:     Mental Status: She is alert. Mental status is at baseline.  Psychiatric:        Behavior: Behavior normal.     ED Results / Procedures / Treatments   Labs (all labs ordered are listed, but only abnormal results are displayed) Labs Reviewed  PREGNANCY, URINE - Abnormal; Notable for the following components:      Result Value   Preg Test, Ur POSITIVE (*)    All other components within normal limits  URINALYSIS, ROUTINE W REFLEX MICROSCOPIC  - Abnormal; Notable for the following components:   Color, Urine COLORLESS (*)    Specific Gravity, Urine 1.001 (*)    All other components within normal limits  CBC WITH DIFFERENTIAL/PLATELET - Abnormal; Notable for the following components:   RBC 5.47 (*)    MCV 70.2 (*)    MCH 22.3 (*)    RDW 16.3 (*)    Lymphs Abs 4.5 (*)    All other components within normal limits  COMPREHENSIVE METABOLIC PANEL - Abnormal; Notable for the following components:   Calcium 8.6 (*)    AST 95 (*)    ALT 110 (*)    All other components within normal limits  HCG, QUANTITATIVE, PREGNANCY - Abnormal; Notable for the following components:   hCG, Beta Chain, Quant, S 2,881 (*)    All other components within normal limits    EKG None  Radiology No results found.  Procedures Procedures  {Document cardiac monitor, telemetry assessment procedure when appropriate:1}  Medications Ordered in ED Medications - No data to display  ED Course/ Medical Decision Making/ A&P   {   Click here for ABCD2, Fischer and other calculatorsREFRESH Note before signing :1}                          Medical Decision Making Amount and/or Complexity of Data Reviewed Labs: ordered. Decision-making details documented in ED Course. Radiology: ordered. Decision-making details documented in ED Course.   Medical Decision Making:   Jennifer Fischer is a 22 y.o. female who presented to the ED today with abdominal pain detailed above.     Complete initial physical exam performed, notably the patient  was in NAD. Abdomen soft, nontender. No CVA tenderness. Pt well appearing.    Reviewed and confirmed nursing documentation for past medical history, family history, social history.    Initial Assessment:   With the patient's presentation of abdominal pain, differential diagnosis includes but is not limited to AAA, mesenteric ischemia, appendicitis, diverticulitis, DKA, gastritis, gastroenteritis, AMI, nephrolithiasis, pancreatitis,  peritonitis, adrenal insufficiency, intestinal ischemia, constipation, UTI, SBO/LBO, splenic rupture, biliary disease, IBD, IBS, PUD, hepatitis, STD, ovarian/testicular torsion, electrolyte disturbance, DKA, dehydration, acute kidney injury, renal failure, cholecystitis, cholelithiasis, choledocholithiasis, abdominal pain of  unknown etiology,  pregnancy, incomplete abortion, septic abortion, threatened abortion, ectopic pregnancy, PID.   Initial Plan:  Screening labs including CBC and Metabolic panel to evaluate for infectious or metabolic etiology of disease.  Urinalysis with reflex culture ordered to evaluate for UTI or relevant urologic/nephrologic pathology.  Urine  pregnancy > beta Hcg [place] to evaluate for intra-abdominal pathology Symptomatic management, currently asymptomatic Objective evaluation as reviewed   Initial Study Results:   Laboratory  All laboratory results reviewed without evidence of clinically relevant pathology.   Exceptions include: ***   Radiology:  All images reviewed independently. Agree with radiology report at this time.   No results found.    Consults: Case discussed with ***.   Final Assessment and Plan:   ***    Clinical Impression: No diagnosis found.   Data Unavailable    {Document critical care time when appropriate:1} {Document review of labs and clinical decision tools ie Fischer score, Chads2Vasc2 etc:1}  {Document your independent review of radiology images, and any outside records:1} {Document your discussion with family members, caretakers, and with consultants:1} {Document social determinants of health affecting pt's care:1} {Document your decision making why or why not admission, treatments were needed:1} Final Clinical Impression(s) / ED Diagnoses Final diagnoses:  None    Rx / DC Orders ED Discharge Orders     None

## 2023-04-08 ENCOUNTER — Other Ambulatory Visit: Payer: Self-pay | Admitting: Family Medicine

## 2023-04-08 DIAGNOSIS — O3680X0 Pregnancy with inconclusive fetal viability, not applicable or unspecified: Secondary | ICD-10-CM

## 2023-04-09 ENCOUNTER — Ambulatory Visit (INDEPENDENT_AMBULATORY_CARE_PROVIDER_SITE_OTHER): Payer: Medicaid Other

## 2023-04-09 DIAGNOSIS — Z3A01 Less than 8 weeks gestation of pregnancy: Secondary | ICD-10-CM

## 2023-04-09 DIAGNOSIS — O3680X0 Pregnancy with inconclusive fetal viability, not applicable or unspecified: Secondary | ICD-10-CM

## 2023-04-09 NOTE — Progress Notes (Signed)
Patient presents for HCG labs. Lab order given to patient.

## 2023-04-10 LAB — BETA HCG QUANT (REF LAB): hCG Quant: 4297 m[IU]/mL

## 2023-04-13 ENCOUNTER — Ambulatory Visit (HOSPITAL_BASED_OUTPATIENT_CLINIC_OR_DEPARTMENT_OTHER): Payer: Medicaid Other

## 2023-04-20 ENCOUNTER — Ambulatory Visit (HOSPITAL_BASED_OUTPATIENT_CLINIC_OR_DEPARTMENT_OTHER)
Admission: RE | Admit: 2023-04-20 | Discharge: 2023-04-20 | Disposition: A | Discharge status: Home / Self Care | Payer: Medicaid Other | Source: Ambulatory Visit | Attending: Family Medicine | Admitting: Family Medicine

## 2023-04-20 DIAGNOSIS — O3680X0 Pregnancy with inconclusive fetal viability, not applicable or unspecified: Secondary | ICD-10-CM

## 2023-04-29 ENCOUNTER — Telehealth (HOSPITAL_BASED_OUTPATIENT_CLINIC_OR_DEPARTMENT_OTHER): Payer: Self-pay

## 2023-05-20 ENCOUNTER — Encounter: Payer: Medicaid Other | Admitting: Family Medicine

## 2023-05-25 ENCOUNTER — Encounter: Payer: Medicaid Other | Admitting: Obstetrics and Gynecology

## 2023-08-03 ENCOUNTER — Other Ambulatory Visit: Payer: Self-pay

## 2023-08-03 ENCOUNTER — Emergency Department (HOSPITAL_BASED_OUTPATIENT_CLINIC_OR_DEPARTMENT_OTHER)
Admission: EM | Admit: 2023-08-03 | Discharge: 2023-08-03 | Disposition: A | Discharge status: Home / Self Care | Payer: Medicaid Other | Attending: Emergency Medicine | Admitting: Emergency Medicine

## 2023-08-03 ENCOUNTER — Encounter (HOSPITAL_BASED_OUTPATIENT_CLINIC_OR_DEPARTMENT_OTHER): Payer: Self-pay | Admitting: Emergency Medicine

## 2023-08-03 DIAGNOSIS — Z3A21 21 weeks gestation of pregnancy: Secondary | ICD-10-CM | POA: Diagnosis not present

## 2023-08-03 DIAGNOSIS — O99012 Anemia complicating pregnancy, second trimester: Secondary | ICD-10-CM | POA: Insufficient documentation

## 2023-08-03 DIAGNOSIS — R109 Unspecified abdominal pain: Secondary | ICD-10-CM

## 2023-08-03 DIAGNOSIS — O99512 Diseases of the respiratory system complicating pregnancy, second trimester: Secondary | ICD-10-CM | POA: Insufficient documentation

## 2023-08-03 DIAGNOSIS — O26892 Other specified pregnancy related conditions, second trimester: Secondary | ICD-10-CM | POA: Diagnosis present

## 2023-08-03 DIAGNOSIS — J45909 Unspecified asthma, uncomplicated: Secondary | ICD-10-CM | POA: Insufficient documentation

## 2023-08-03 DIAGNOSIS — R1012 Left upper quadrant pain: Secondary | ICD-10-CM | POA: Diagnosis not present

## 2023-08-03 LAB — URINALYSIS, ROUTINE W REFLEX MICROSCOPIC
Bilirubin Urine: NEGATIVE
Glucose, UA: NEGATIVE mg/dL
Hgb urine dipstick: NEGATIVE
Ketones, ur: NEGATIVE mg/dL
Leukocytes,Ua: NEGATIVE
Nitrite: NEGATIVE
Protein, ur: NEGATIVE mg/dL
Specific Gravity, Urine: 1.02 (ref 1.005–1.030)
pH: 7.5 (ref 5.0–8.0)

## 2023-08-03 LAB — COMPREHENSIVE METABOLIC PANEL
ALT: 15 U/L (ref 0–44)
AST: 17 U/L (ref 15–41)
Albumin: 3.2 g/dL — ABNORMAL LOW (ref 3.5–5.0)
Alkaline Phosphatase: 54 U/L (ref 38–126)
Anion gap: 9 (ref 5–15)
BUN: 5 mg/dL — ABNORMAL LOW (ref 6–20)
CO2: 22 mmol/L (ref 22–32)
Calcium: 8.5 mg/dL — ABNORMAL LOW (ref 8.9–10.3)
Chloride: 103 mmol/L (ref 98–111)
Creatinine, Ser: 0.47 mg/dL (ref 0.44–1.00)
GFR, Estimated: >60 mL/min (ref >60)
Glucose, Bld: 74 mg/dL (ref 70–99)
Potassium: 3.5 mmol/L (ref 3.5–5.1)
Sodium: 134 mmol/L — ABNORMAL LOW (ref 135–145)
Total Bilirubin: 0.4 mg/dL (ref 0.3–1.2)
Total Protein: 6.5 g/dL (ref 6.5–8.1)

## 2023-08-03 LAB — CBC
HCT: 29.2 % — ABNORMAL LOW (ref 36.0–46.0)
Hemoglobin: 9.5 g/dL — ABNORMAL LOW (ref 12.0–15.0)
MCH: 24.1 pg — ABNORMAL LOW (ref 26.0–34.0)
MCHC: 32.5 g/dL (ref 30.0–36.0)
MCV: 74.1 fL — ABNORMAL LOW (ref 80.0–100.0)
Platelets: 258 10*3/uL (ref 150–400)
RBC: 3.94 MIL/uL (ref 3.87–5.11)
RDW: 15.7 % — ABNORMAL HIGH (ref 11.5–15.5)
WBC: 8.4 10*3/uL (ref 4.0–10.5)
nRBC: 0 % (ref 0.0–0.2)

## 2023-08-03 LAB — HCG, QUANTITATIVE, PREGNANCY: hCG, Beta Chain, Quant, S: 5372 m[IU]/mL — ABNORMAL HIGH (ref <5)

## 2023-08-03 MED ORDER — ACETAMINOPHEN 325 MG PO TABS
650.0000 mg | ORAL_TABLET | Freq: Once | ORAL | Status: AC
  Administered 2023-08-03: 650 mg via ORAL
  Filled 2023-08-03: qty 2

## 2023-08-03 NOTE — ED Provider Notes (Signed)
[place] EMERGENCY DEPARTMENT AT [facility] Provider Note   CSN: [id number] Arrival date & time: 08/03/23  1605     History  Chief Complaint  Patient presents with   Abdominal Pain    LUQ   HPI Jennifer Fischer is a 22 y.o. female who reports that she is [redacted] weeks pregnant with history of asthma presenting for left upper quadrant pain.  States she woke up this morning from sleep and felt pain in her left side.  States the day before she spent most of the day lifting heavy furniture with her mother.  Denies chest pain or shortness of breath.  It is not worse with breathing.  States right now the pain has resolved but she can feel it if she presses into her side with her hand.  Denies any direct trauma to that area.  Denies urinary changes.  Denies nausea vomiting diarrhea.  Denies abnormal vaginal bleeding or pelvic pain.   Abdominal Pain      Home Medications Prior to Admission medications   Medication Sig Start Date End Date Taking? Authorizing Provider  albuterol (PROVENTIL HFA;VENTOLIN HFA) 108 (90 BASE) MCG/ACT inhaler Inhale 2 puffs into the lungs every 6 (six) hours as needed for wheezing or shortness of breath.    [provider]  beclomethasone (QVAR) 40 MCG/ACT inhaler Inhale 2 puffs into the lungs 2 (two) times daily.    [provider]  cetirizine (ZYRTEC) 10 MG tablet Take 10 mg by mouth at bedtime. 08/27/15   [provider]  cloNIDine (CATAPRES) 0.1 MG tablet Take 0.2 mg by mouth at bedtime. 07/23/15   [provider]  doxylamine, Sleep, (UNISOM) 25 MG tablet Take 0.5 tablets (12.5 mg total) by mouth 3 (three) times daily as needed for up to 5 days. 04/07/23 04/12/23  Gowens, Mariah L, PA-C  ibuprofen (ADVIL) 800 MG tablet Take 1 tablet (800 mg total) by mouth every 8 (eight) hours as needed. 01/28/21   [name], MD  methylphenidate (METADATE CD) 40 MG CR capsule Take 40 mg by mouth every morning.    [provider]  nitrofurantoin, macrocrystal-monohydrate, (MACROBID) 100 MG capsule Take 1 capsule (100 mg total) by mouth 2 (two) times daily. 08/05/20   [name], FNP      Allergies    Patient has no known allergies.    Review of Systems   Review of Systems  Gastrointestinal:  Positive for abdominal pain.    Physical Exam Updated Vital Signs BP 118/78   Pulse 77   Temp 98.8 F (37.1 C) (Oral)   Resp 18   Wt 59 kg   LMP  (Exact Date)   SpO2 100%   BMI 23.78 kg/m  Physical Exam Vitals and nursing note reviewed.  HENT:     Head: Normocephalic and atraumatic.     Mouth/Throat:     Mouth: Mucous membranes are moist.  Eyes:     General:        Right eye: No discharge.        Left eye: No discharge.     Conjunctiva/sclera: Conjunctivae normal.  Cardiovascular:     Rate and Rhythm: Normal rate and regular rhythm.     Pulses: Normal pulses.     Heart sounds: Normal heart sounds.  Pulmonary:     Effort: Pulmonary effort is normal.     Breath sounds: Normal breath sounds.  Abdominal:     General: Abdomen is flat.  Palpations: Abdomen is soft.     Tenderness: There is abdominal tenderness. There is no left CVA tenderness.    Skin:    General: Skin is warm and dry.  Neurological:     General: No focal deficit present.  Psychiatric:        Mood and Affect: Mood normal.     ED Results / Procedures / Treatments   Labs (all labs ordered are listed, but only abnormal results are displayed) Labs Reviewed  CBC - Abnormal; Notable for the following components:      Result Value   Hemoglobin 9.5 (*)    HCT 29.2 (*)    MCV 74.1 (*)    MCH 24.1 (*)    RDW 15.7 (*)    All other components within normal limits  HCG, QUANTITATIVE, PREGNANCY - Abnormal; Notable for the following components:   hCG, Beta Chain, Quant, S 5,372 (*)    All other components within normal limits  COMPREHENSIVE METABOLIC PANEL - Abnormal; Notable for the following components:    Sodium 134 (*)    BUN 5 (*)    Calcium 8.5 (*)    Albumin 3.2 (*)    All other components within normal limits  URINALYSIS, ROUTINE W REFLEX MICROSCOPIC - Abnormal; Notable for the following components:   APPearance CLOUDY (*)    All other components within normal limits    EKG EKG Interpretation Date/Time:  Monday August 03 2023 16:31:39 EDT Ventricular Rate:  75 PR Interval:  170 QRS Duration:  94 QT Interval:  400 QTC Calculation: 447 R Axis:   46  Text Interpretation: Sinus rhythm Confirmed by Coralee Pesa [phone]) on 08/03/2023 10:31:52 PM  Radiology No results found.  Procedures Procedures    Medications Ordered in ED Medications  acetaminophen (TYLENOL) tablet 650 mg (650 mg Oral Given 08/03/23 2105)    ED Course/ Medical Decision Making/ A&P                                 Medical Decision Making Amount and/or Complexity of Data Reviewed Labs: ordered.  Risk OTC drugs.   Initial Impression and Ddx 22 year old well-appearing female who is [redacted] weeks pregnant presenting for left-sided abdominal pain.  Exam notable for upper left-sided abdominal tenderness.  DDx includes nephrolithiasis, pyelonephritis, pneumothorax, PE, placental abruption, MSK pain. Patient PMH that increases complexity of ED encounter:  pregnant  Interpretation of Diagnostics - I independent reviewed and interpreted the labs as followed: anemia  -I personally reviewed and interpreted EKG which revealed sinus rhythm.  Patient Reassessment and Ultimate Disposition/Management Overall patient remained well in no acute distress and clinically stable. On reassessment, continued to deny pain.  On exam there was point tenderness in the side of her left abdomen not in the flank nor in the left upper quadrant. Suspect this is MSK pain. Treated with Tylenol. Advised conservative treatment at home.  Also considered PE but unlikely given no chest pain or shortness of breath and tachycardia improved  without intervention and EKG is reassuring. Also doubt uterine or placental pain given no lower abdominal pain and no abnormal vaginal bleeding or discharge.  Also doubt nephrolithiasis or pyelonephritis given no flank tenderness and reassuring UA.  Advised her to follow-up with her OB/Gyn provider.  Discussed strict return precautions.  Patient management required discussion with the following services or consulting groups:  None  Complexity of Problems Addressed Acute complicated illness or Injury  Additional Data Reviewed and Analyzed Further history obtained from: Past medical history and medications listed in the EMR, Prior ED visit notes, and Recent discharge summary  Patient Encounter Risk Assessment None         Final Clinical Impression(s) / ED Diagnoses Final diagnoses:  Left sided abdominal pain    Rx / DC Orders ED Discharge Orders     None         Gareth Eagle, PA-C 08/03/23 2238    Horton, Clabe Seal, DO 08/04/23 0037

## 2023-08-03 NOTE — ED Triage Notes (Addendum)
LUQ pain this morning , 21 weeks preg. OB sent her to "hospital " for further evaluation . Needs [place] .  Denies urinary symptoms , no NVD . Felt the baby moving she said .  No vaginal bleeding .

## 2023-08-03 NOTE — Discharge Instructions (Addendum)
Evaluation was overall reassuring. Suspect this is muscle pain he sustained while lifting furniture in her house.  Recommend conservative treatment at home which includes ice, gentle stretching and rest of that area that hurts. You can also take Tylenol as needed for pain. Recommend you follow-up with your OB/GYN provider.  If you develop lower abdominal pain, abnormal vaginal bleeding or discharge, urinary changes or fever please return emergency department further evaluation.

## 2023-10-22 ENCOUNTER — Encounter (HOSPITAL_COMMUNITY): Payer: Self-pay | Admitting: Emergency Medicine

## 2023-10-22 ENCOUNTER — Other Ambulatory Visit: Payer: Self-pay

## 2023-10-22 ENCOUNTER — Emergency Department (HOSPITAL_COMMUNITY)
Admission: EM | Admit: 2023-10-22 | Discharge: 2023-10-22 | Disposition: A | Discharge status: Home / Self Care | Payer: Medicaid Other | Attending: Emergency Medicine | Admitting: Emergency Medicine

## 2023-10-22 DIAGNOSIS — R519 Headache, unspecified: Secondary | ICD-10-CM | POA: Insufficient documentation

## 2023-10-22 DIAGNOSIS — W19XXXA Unspecified fall, initial encounter: Secondary | ICD-10-CM | POA: Insufficient documentation

## 2023-10-22 DIAGNOSIS — O26893 Other specified pregnancy related conditions, third trimester: Secondary | ICD-10-CM | POA: Diagnosis present

## 2023-10-22 DIAGNOSIS — S8001XA Contusion of right knee, initial encounter: Secondary | ICD-10-CM | POA: Diagnosis not present

## 2023-10-22 DIAGNOSIS — Z3A33 33 weeks gestation of pregnancy: Secondary | ICD-10-CM | POA: Diagnosis not present

## 2023-10-22 DIAGNOSIS — Y92009 Unspecified place in unspecified non-institutional (private) residence as the place of occurrence of the external cause: Secondary | ICD-10-CM | POA: Insufficient documentation

## 2023-10-22 MED ORDER — ACETAMINOPHEN 500 MG PO TABS
1000.0000 mg | ORAL_TABLET | Freq: Once | ORAL | Status: AC
  Administered 2023-10-22: 1000 mg via ORAL
  Filled 2023-10-22: qty 2

## 2023-10-22 NOTE — Progress Notes (Addendum)
RROB nurse called to assess pt who is G1P0 at 33w 1d who fell on her side in the shower last night around 2100.  Pt reports a headache, mild knee and left side pain.  She denies abdominal pain, LOF or vaginal bleeding.  She endorses positive fetal movement.  Pt receives [facility] in [facility] and reports a normal pregnancy.  FHR monitors applied. RN to assess.

## 2023-10-22 NOTE — ED Notes (Signed)
Rapid OB called  

## 2023-10-22 NOTE — ED Provider Notes (Signed)
[place] EMERGENCY DEPARTMENT AT [facility] Provider Note   CSN: [id number] Arrival date & time: 10/22/23  1235     History  Chief Complaint  Patient presents with   Fall   Headache   Knee Pain    Jennifer Fischer is a 22 y.o. female.  The history is provided by the patient and medical records. No language interpreter was used.  Fall Associated symptoms include headaches.  Headache Knee Pain    22 year old female who is currently [redacted] weeks pregnant presenting for evaluation of a fall.  Patient report last night she was in the shower and slipped fell forward striking her forehead, left shoulder, and right knee against the ground.  She denies any loss of consciousness.  She was able to ambulate afterward.  She denies striking her abdomen or having any abdominal pain.  No vaginal bleeding.  Does endorse some throbbing headache.  Denies any precipitating symptoms prior to the fall.  States her pregnancy has been going well so far.  This is her first pregnancy.  Home Medications Prior to Admission medications   Medication Sig Start Date End Date Taking? Authorizing Provider  albuterol (PROVENTIL HFA;VENTOLIN HFA) 108 (90 BASE) MCG/ACT inhaler Inhale 2 puffs into the lungs every 6 (six) hours as needed for wheezing or shortness of breath.    [provider]  beclomethasone (QVAR) 40 MCG/ACT inhaler Inhale 2 puffs into the lungs 2 (two) times daily.    [provider]  cetirizine (ZYRTEC) 10 MG tablet Take 10 mg by mouth at bedtime. 08/27/15   [provider]  cloNIDine (CATAPRES) 0.1 MG tablet Take 0.2 mg by mouth at bedtime. 07/23/15   [provider]  doxylamine, Sleep, (UNISOM) 25 MG tablet Take 0.5 tablets (12.5 mg total) by mouth 3 (three) times daily as needed for up to 5 days. 04/07/23 04/12/23  Gowens, Mariah L, PA-C  ibuprofen (ADVIL) 800 MG tablet Take 1 tablet (800 mg total) by mouth every 8 (eight) hours as needed. 01/28/21    [name], MD  methylphenidate (METADATE CD) 40 MG CR capsule Take 40 mg by mouth every morning.    [provider]  nitrofurantoin, macrocrystal-monohydrate, (MACROBID) 100 MG capsule Take 1 capsule (100 mg total) by mouth 2 (two) times daily. 08/05/20   [name], FNP      Allergies    Patient has no known allergies.    Review of Systems   Review of Systems  Neurological:  Positive for headaches.  All other systems reviewed and are negative.   Physical Exam Updated Vital Signs BP 109/72 (BP Location: Right Arm)   Pulse (!) 114   Temp 98.2 F (36.8 C) (Oral)   Resp 16   Ht 5\' 2"  (1.575 m)   Wt 59 kg   LMP 03/04/2023   SpO2 100%   BMI 23.78 kg/m  Physical Exam Vitals and nursing note reviewed.  Constitutional:      General: She is not in acute distress.    Appearance: She is well-developed.  HENT:     Head: Atraumatic.     Comments: Mild left forehead tenderness no bruising no laceration no Battle sign and no raccoon's eyes. Eyes:     Conjunctiva/sclera: Conjunctivae normal.  Cardiovascular:     Rate and Rhythm: Normal rate and regular rhythm.     Pulses: Normal pulses.     Heart sounds: Normal heart sounds.  Pulmonary:     Effort: Pulmonary effort is normal.  Abdominal:     Comments: Gravid abdomen nontender to palpation no bruising noted  Musculoskeletal:        General: Tenderness (Right knee: Faint contusion noted to the anterior knee with mild tenderness to palpation but normal flexion and extension and no deformity noted.) present.     Cervical back: Neck supple.     Comments: Mild tenderness about the left shoulder with full range of motion and no deformity noted.  Skin:    Findings: No rash.  Neurological:     Mental Status: She is alert.  Psychiatric:        Mood and Affect: Mood normal.     ED Results / Procedures / Treatments   Labs (all labs ordered are listed, but only abnormal results are displayed) Labs Reviewed -  No data to display  EKG None  Radiology No results found.  Procedures Procedures    Medications Ordered in ED Medications  acetaminophen (TYLENOL) tablet 1,000 mg (1,000 mg Oral Given 10/22/23 1353)    ED Course/ Medical Decision Making/ A&P                                 Medical Decision Making Risk OTC drugs.   BP 109/72 (BP Location: Right Arm)   Pulse (!) 114   Temp 98.2 F (36.8 C) (Oral)   Resp 16   Ht 5\' 2"  (1.575 m)   Wt 59 kg   LMP 03/04/2023   SpO2 100%   BMI 23.78 kg/m   35:39 PM  22 year old female who is currently [redacted] weeks pregnant presenting for evaluation of a fall.  Patient report last night she was in the shower and slipped fell forward striking her forehead, left shoulder, and right knee against the ground.  She denies any loss of consciousness.  She was able to ambulate afterward.  She denies striking her abdomen or having any abdominal pain.  No vaginal bleeding.  Does endorse some throbbing headache.  Denies any precipitating symptoms prior to the fall.  States her pregnancy has been going well so far.  This is her first pregnancy.  On exam patient has some tenderness to her right knee with faint bruising but she is able to flex and extend the knee without difficulty and her patella is located.  I have low suspicion for fracture or dislocation.  X-ray considered but not performed.  Mild left forehead tenderness and left shoulder tenderness but no significant signs of injury suggestive of advanced imaging.  She has a gravid abdomen nontender to palpation.  Since she is fall on her pregnancy journey, rapid OB nurse was available to assess fetal heart tone which appears within normal limits.  Pt was cleared by OBGYN.  Pt stable for discharge.  Recommend tylenol as needed for pain.  Return precaution was given.         Final Clinical Impression(s) / ED Diagnoses Final diagnoses:  Fall at home, initial encounter  Contusion of right knee, initial  encounter  [redacted] weeks gestation of pregnancy    Rx / DC Orders ED Discharge Orders     None         Fayrene Helper, Cordelia Poche 10/24/23 2053    Linwood Dibbles, MD 10/26/23 [phone]

## 2023-10-22 NOTE — Progress Notes (Signed)
Dr Debroah Loop made aware of pt status and reactive NST.  MD says that pt may be cleared obstetrically given that the fall was greater than 12hrs ago.

## 2023-10-22 NOTE — Discharge Instructions (Signed)
Take tylenol as needed for pain.  Apply ice pack to right knee for comfort.  Follow up with your OBGYN for further care.

## 2023-10-22 NOTE — ED Triage Notes (Addendum)
Patient presents post slip and fall in the shower last night. She reports falling on her left side. She now complains of knee pain and a headache. The baby is still moving according to the patient.

## 2023-10-31 ENCOUNTER — Other Ambulatory Visit: Payer: Self-pay

## 2023-10-31 ENCOUNTER — Encounter (HOSPITAL_COMMUNITY): Payer: Self-pay | Admitting: Obstetrics & Gynecology

## 2023-10-31 ENCOUNTER — Inpatient Hospital Stay (HOSPITAL_COMMUNITY)
Admission: AD | Admit: 2023-10-31 | Discharge: 2023-11-01 | DRG: 833 | Disposition: A | Discharge status: Home / Self Care | Payer: Medicaid Other | Attending: Obstetrics & Gynecology | Admitting: Obstetrics & Gynecology

## 2023-10-31 DIAGNOSIS — W2212XA Striking against or struck by front passenger side automobile airbag, initial encounter: Secondary | ICD-10-CM

## 2023-10-31 DIAGNOSIS — R519 Headache, unspecified: Secondary | ICD-10-CM | POA: Diagnosis present

## 2023-10-31 DIAGNOSIS — Y92410 Unspecified street and highway as the place of occurrence of the external cause: Secondary | ICD-10-CM

## 2023-10-31 DIAGNOSIS — O26893 Other specified pregnancy related conditions, third trimester: Secondary | ICD-10-CM | POA: Diagnosis present

## 2023-10-31 DIAGNOSIS — Z7951 Long term (current) use of inhaled steroids: Secondary | ICD-10-CM | POA: Diagnosis not present

## 2023-10-31 DIAGNOSIS — O9A213 Injury, poisoning and certain other consequences of external causes complicating pregnancy, third trimester: Secondary | ICD-10-CM | POA: Diagnosis not present

## 2023-10-31 DIAGNOSIS — S30811A Abrasion of abdominal wall, initial encounter: Secondary | ICD-10-CM | POA: Diagnosis present

## 2023-10-31 DIAGNOSIS — Z3A34 34 weeks gestation of pregnancy: Secondary | ICD-10-CM | POA: Diagnosis not present

## 2023-10-31 LAB — TYPE AND SCREEN
ABO/RH(D): O POS
Antibody Screen: NEGATIVE

## 2023-10-31 MED ORDER — SODIUM CHLORIDE 0.9% FLUSH
3.0000 mL | Freq: Two times a day (BID) | INTRAVENOUS | Status: DC
  Administered 2023-10-31 – 2023-11-01 (×2): 3 mL via INTRAVENOUS

## 2023-10-31 MED ORDER — SODIUM CHLORIDE 0.9% FLUSH: 3.0000 mL | INTRAVENOUS | Status: DC | PRN

## 2023-10-31 MED ORDER — DOCUSATE SODIUM 100 MG PO CAPS
100.0000 mg | ORAL_CAPSULE | Freq: Every day | ORAL | Status: DC
  Administered 2023-11-01: 100 mg via ORAL
  Filled 2023-10-31: qty 1

## 2023-10-31 MED ORDER — ZOLPIDEM TARTRATE 5 MG PO TABS: 5.0000 mg | ORAL_TABLET | Freq: Every evening | ORAL | Status: DC | PRN

## 2023-10-31 MED ORDER — ACETAMINOPHEN 325 MG PO TABS: 650.0000 mg | ORAL_TABLET | ORAL | Status: DC | PRN

## 2023-10-31 MED ORDER — CALCIUM CARBONATE ANTACID 500 MG PO CHEW: 2.0000 | CHEWABLE_TABLET | ORAL | Status: DC | PRN

## 2023-10-31 MED ORDER — PRENATAL MULTIVITAMIN CH
1.0000 | ORAL_TABLET | Freq: Every day | ORAL | Status: DC
  Administered 2023-11-01: 1 via ORAL
  Filled 2023-10-31: qty 1
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
## 2023-10-31 NOTE — MAU Note (Addendum)
Third attempt to speak with ED Charge RN. ED Secretary stated RN was busy and that the Charge RN requested this RN send her a secure chat for report.  Consulting civil engineer, Jamie Blue-Matthews.

## 2023-10-31 NOTE — ED Notes (Signed)
This RN has called OB rapid response nurse who states she will come monitor the baby when she gets a room.
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
## 2023-10-31 NOTE — ED Notes (Signed)
Upon clarification, this RN has called over to MAU and they did not evaluate the baby. This RN will now call the rapid response nurse.

## 2023-10-31 NOTE — MAU Note (Signed)
Attempt x2 to call report to ED Charge RN.
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
## 2023-11-01 DIAGNOSIS — Z3A34 34 weeks gestation of pregnancy: Secondary | ICD-10-CM

## 2023-11-01 DIAGNOSIS — O9A213 Injury, poisoning and certain other consequences of external causes complicating pregnancy, third trimester: Secondary | ICD-10-CM

## 2023-11-01 MED ORDER — BACITRACIN-NEOMYCIN-POLYMYXIN OINTMENT TUBE
TOPICAL_OINTMENT | Freq: Two times a day (BID) | CUTANEOUS | Status: DC
Start: 2023-11-01 — End: 2023-11-01
  Administered 2023-11-01: 1 via TOPICAL
  Filled 2023-11-01: qty 14
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
